# Patient Record
Sex: Male | Born: 2000 | Race: White | Hispanic: No | Smoking: Never smoker
Health system: Southern US, Community
[De-identification: ages and names within clinical notes are randomized; demographics above are authoritative.]

---
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
## 2022-08-15 ENCOUNTER — Other Ambulatory Visit: Payer: Self-pay

## 2022-08-15 ENCOUNTER — Emergency Department (HOSPITAL_BASED_OUTPATIENT_CLINIC_OR_DEPARTMENT_OTHER): Payer: Medicaid Other

## 2022-08-15 ENCOUNTER — Encounter (HOSPITAL_BASED_OUTPATIENT_CLINIC_OR_DEPARTMENT_OTHER): Payer: Self-pay | Admitting: Emergency Medicine

## 2022-08-15 ENCOUNTER — Emergency Department (HOSPITAL_BASED_OUTPATIENT_CLINIC_OR_DEPARTMENT_OTHER)
Admission: EM | Admit: 2022-08-15 | Discharge: 2022-08-15 | Disposition: A | Discharge status: Home / Self Care | Payer: Medicaid Other | Attending: Emergency Medicine | Admitting: Emergency Medicine

## 2022-08-15 DIAGNOSIS — S92354A Nondisplaced fracture of fifth metatarsal bone, right foot, initial encounter for closed fracture: Secondary | ICD-10-CM | POA: Insufficient documentation

## 2022-08-15 DIAGNOSIS — X58XXXA Exposure to other specified factors, initial encounter: Secondary | ICD-10-CM | POA: Insufficient documentation

## 2022-08-15 DIAGNOSIS — S99921A Unspecified injury of right foot, initial encounter: Secondary | ICD-10-CM | POA: Diagnosis present

## 2022-08-15 DIAGNOSIS — Y9367 Activity, basketball: Secondary | ICD-10-CM | POA: Diagnosis not present

## 2022-08-15 NOTE — ED Provider Notes (Signed)
[facility] EMERGENCY DEPARTMENT Provider Note   CSN: [id number] Arrival date & time: 08/15/22  1123     History  Chief Complaint  Patient presents with   Foot Pain    Aaron Cross is a 21 y.o. male. Patient is presenting with right foot pain after injuring it while playing basketball about 1 week ago.  Has been weightbearing on it but is still having sharp pains when he puts weight on it.  He states that most the pain is on the lateral aspect of the right foot.  Denies any numbness or tingling.   Foot Pain       Home Medications Prior to Admission medications   Medication Sig Start Date End Date Taking? Authorizing Provider  diphenhydrAMINE (BENADRYL) 25 MG tablet Take 1 tablet (25 mg total) by mouth every 6 (six) hours as needed. 02/10/20   Horton, Mayer Masker, MD  famotidine (PEPCID) 20 MG tablet Take 1 tablet (20 mg total) by mouth daily. 02/10/20   Horton, Mayer Masker, MD  montelukast (SINGULAIR) 10 MG tablet Take 10 mg by mouth at bedtime.    [provider]  predniSONE (DELTASONE) 20 MG tablet Take 2 tablets (40 mg total) by mouth daily. 02/10/20   Horton, Mayer Masker, MD      Allergies    Patient has no known allergies.    Review of Systems   Review of Systems  Musculoskeletal:  Positive for arthralgias.  All other systems reviewed and are negative.   Physical Exam Updated Vital Signs BP 123/73   Pulse 76   Temp 98 F (36.7 C) (Oral)   Resp 19   SpO2 100%  Physical Exam Vitals and nursing note reviewed.  Constitutional:      General: He is not in acute distress.    Appearance: Normal appearance. He is well-developed. He is not ill-appearing, toxic-appearing or diaphoretic.  HENT:     Head: Normocephalic and atraumatic.     Nose: No nasal deformity.     Mouth/Throat:     Lips: Pink. No lesions.  Eyes:     General: Gaze aligned appropriately. No scleral icterus.       Right eye: No discharge.        Left eye: No discharge.      Conjunctiva/sclera: Conjunctivae normal.     Right eye: Right conjunctiva is not injected. No exudate or hemorrhage.    Left eye: Left conjunctiva is not injected. No exudate or hemorrhage. Pulmonary:     Effort: Pulmonary effort is normal. No respiratory distress.  Musculoskeletal:     Comments: Right ankle without any swelling.  There is some ecchymosis noted on the right lateral malleolus area.  He does some some mild swelling along the right lateral aspect of the midfoot.  That is where he is tender to touch.  He has a 2+ pedal pulse.  Sensation is intact.  Skin:    General: Skin is warm and dry.  Neurological:     Mental Status: He is alert and oriented to person, place, and time.  Psychiatric:        Mood and Affect: Mood normal.        Speech: Speech normal.        Behavior: Behavior normal. Behavior is cooperative.     ED Results / Procedures / Treatments   Labs (all labs ordered are listed, but only abnormal results are displayed) Labs Reviewed - No data to display  EKG None  Radiology  DG Foot Complete Right  Result Date: 08/15/2022 CLINICAL DATA:  Trauma, pain EXAM: RIGHT FOOT COMPLETE - 3+ VIEW COMPARISON:  None Available. FINDINGS: There is undisplaced fracture in the base of right fifth metatarsal. IMPRESSION: Undisplaced fracture is seen in the base of right fifth metatarsal. Electronically Signed   By: Ernie Avena M.D.   On: 08/15/2022 12:09    Procedures Procedures   Medications Ordered in ED Medications - No data to display  ED Course/ Medical Decision Making/ A&P                           Medical Decision Making Amount and/or Complexity of Data Reviewed Radiology: ordered.   Patient is presenting with continued right foot pain after basketball injury that he had about 1 week ago.  His exam is neurovascularly intact.  He has soft compartments.  We did an x-ray today that reveals a nondisplaced right fifth metatarsal fracture.  This is consistent  with where the patient is having his symptoms.  I recommended a cam boot as well as orthopedic follow-up.  Supportive treatment for pain is recommended at home.  Final Clinical Impression(s) / ED Diagnoses Final diagnoses:  Closed nondisplaced fracture of fifth metatarsal bone of right foot, initial encounter    Rx / DC Orders ED Discharge Orders     None         Claudie Leach, PA-C 08/15/22 1359    Sloan Leiter, DO 08/17/22 [phone]

## 2022-08-15 NOTE — ED Triage Notes (Signed)
Patient presents to ED via POV from home. Here with right foot pain after injuring it playing basketball a week ago. Limping gait noted.

## 2022-08-15 NOTE — Discharge Instructions (Signed)
You have fractured your fifth metatarsal.  We have placed you in a cam boot.  Use this while walking.  Recommend ibuprofen and Tylenol for pain at home.  Also recommend nonweightbearing when not wearing the boot.  Use ice as needed.  Elevate to decrease swelling.  Please follow-up with Dr. Victorino Dike with orthopedics

## 2022-12-02 ENCOUNTER — Other Ambulatory Visit: Payer: Self-pay

## 2022-12-02 ENCOUNTER — Encounter (HOSPITAL_BASED_OUTPATIENT_CLINIC_OR_DEPARTMENT_OTHER): Payer: Self-pay | Admitting: Emergency Medicine

## 2022-12-02 ENCOUNTER — Emergency Department (HOSPITAL_BASED_OUTPATIENT_CLINIC_OR_DEPARTMENT_OTHER)
Admission: EM | Admit: 2022-12-02 | Discharge: 2022-12-02 | Disposition: A | Discharge status: Home / Self Care | Payer: Medicaid Other | Attending: Emergency Medicine | Admitting: Emergency Medicine

## 2022-12-02 DIAGNOSIS — Z1152 Encounter for screening for COVID-19: Secondary | ICD-10-CM | POA: Diagnosis not present

## 2022-12-02 DIAGNOSIS — J101 Influenza due to other identified influenza virus with other respiratory manifestations: Secondary | ICD-10-CM | POA: Insufficient documentation

## 2022-12-02 DIAGNOSIS — R059 Cough, unspecified: Secondary | ICD-10-CM | POA: Diagnosis present

## 2022-12-02 LAB — RESP PANEL BY RT-PCR (FLU A&B, COVID) ARPGX2
Influenza A by PCR: POSITIVE — AB
Influenza B by PCR: NEGATIVE
SARS Coronavirus 2 by RT PCR: NEGATIVE

## 2022-12-02 MED ORDER — BENZONATATE 100 MG PO CAPS: 100.0000 mg | ORAL_CAPSULE | Freq: Three times a day (TID) | ORAL | 0 refills | Status: AC

## 2022-12-02 MED ORDER — FLUTICASONE PROPIONATE 50 MCG/ACT NA SUSP: 2.0000 | Freq: Every day | NASAL | 0 refills | Status: AC

## 2022-12-02 NOTE — ED Notes (Signed)
Pt verbalized understanding of d/c instructions, meds, and followup care. Denies questions. VSS, no distress noted. Steady gait to exit with all belongings.  ?

## 2022-12-02 NOTE — Discharge Instructions (Addendum)
Take the medications as prescribed  Return for new or worsening symptoms 

## 2022-12-02 NOTE — ED Provider Notes (Signed)
[facility] EMERGENCY DEPT Provider Note   CSN: [id number] Arrival date & time: 12/02/22  1326    History  Chief Complaint  Patient presents with   Cough    Aaron Cross is a 21 y.o. male here for evaluation of cough.  Cough for last 2 days.  Some congestion rhinorrhea.  States when he coughs his chest feels tight however resolves after not coughing.  No pain or swelling to lower legs.  No history of PE or DVT.  Family members positive for flu A.  No fever, shortness of breath, back pain.  No meds PTA.  Feels like when he coughs he "cannot get it up."  No hemoptysis.  He is tolerating p.o. intake at home. Significant other here with similar sx  HPI     Home Medications Prior to Admission medications   Medication Sig Start Date End Date Taking? Authorizing Provider  benzonatate (TESSALON) 100 MG capsule Take 1 capsule (100 mg total) by mouth every 8 (eight) hours. 12/02/22  Yes Fabienne Nolasco A, PA-C  fluticasone (FLONASE) 50 MCG/ACT nasal spray Place 2 sprays into both nostrils daily. 12/02/22  Yes Charlee Whitebread A, PA-C  diphenhydrAMINE (BENADRYL) 25 MG tablet Take 1 tablet (25 mg total) by mouth every 6 (six) hours as needed. 02/10/20   Horton, Mayer Masker, MD  famotidine (PEPCID) 20 MG tablet Take 1 tablet (20 mg total) by mouth daily. 02/10/20   Horton, Mayer Masker, MD  montelukast (SINGULAIR) 10 MG tablet Take 10 mg by mouth at bedtime.    [provider]  predniSONE (DELTASONE) 20 MG tablet Take 2 tablets (40 mg total) by mouth daily. 02/10/20   Horton, Mayer Masker, MD      Allergies    Patient has no known allergies.    Review of Systems   Review of Systems  Constitutional: Negative.   HENT: Negative.    Respiratory:  Positive for cough. Negative for apnea, choking, chest tightness, shortness of breath, wheezing and stridor.   Cardiovascular:  Positive for chest pain (with cough, resolved when not coughing).  Gastrointestinal: Negative.    Genitourinary: Negative.   Musculoskeletal: Negative.   Skin: Negative.   Neurological: Negative.   All other systems reviewed and are negative.   Physical Exam Updated Vital Signs BP 125/82 (BP Location: Right Arm)   Pulse 90   Temp (!) 97.5 F (36.4 C)   Resp 18   Ht 6' (1.829 m)   Wt 79.4 kg   SpO2 100%   BMI 23.73 kg/m  Physical Exam Vitals and nursing note reviewed.  Constitutional:      General: He is not in acute distress.    Appearance: He is well-developed. He is not ill-appearing, toxic-appearing or diaphoretic.  HENT:     Head: Normocephalic and atraumatic.     Nose: Rhinorrhea present. No congestion.     Mouth/Throat:     Mouth: Mucous membranes are moist.  Eyes:     Pupils: Pupils are equal, round, and reactive to light.  Cardiovascular:     Rate and Rhythm: Normal rate and regular rhythm.     Pulses: Normal pulses.     Heart sounds: Normal heart sounds.  Pulmonary:     Effort: Pulmonary effort is normal. No respiratory distress.     Breath sounds: Normal breath sounds. No stridor. No wheezing, rhonchi or rales.  Chest:     Chest wall: No tenderness.  Abdominal:     General: Bowel sounds are normal.  There is no distension.     Palpations: Abdomen is soft.     Tenderness: There is no abdominal tenderness. There is no guarding or rebound.  Musculoskeletal:        General: No swelling, tenderness, deformity or signs of injury. Normal range of motion.     Cervical back: Normal range of motion and neck supple.     Right lower leg: No edema.     Left lower leg: No edema.  Skin:    General: Skin is warm and dry.     Capillary Refill: Capillary refill takes less than 2 seconds.  Neurological:     General: No focal deficit present.     Mental Status: He is alert and oriented to person, place, and time.    ED Results / Procedures / Treatments   Labs (all labs ordered are listed, but only abnormal results are displayed) Labs Reviewed  RESP PANEL BY  RT-PCR (FLU A&B, COVID) ARPGX2 - Abnormal; Notable for the following components:      Result Value   Influenza A by PCR POSITIVE (*)    All other components within normal limits    EKG None  Radiology No results found.  Procedures Procedures    Medications Ordered in ED Medications - No data to display  ED Course/ Medical Decision Making/ A&P    21 year old here for evaluation of cough.  Symptoms x 2 days.  Family members with similar symptoms. Does admit to some chest pain when he coughs however resolves when he is not coughing.  No hemoptysis. He is PERC negative.  No lower extremity edema.  No shortness of breath.  His heart and lungs are clear.  He is clinically well-hydrated.  Labs personally viewed and interpreted:  Flu A positive  Will treat symptomatically.  FU closely outpatient  The patient has been appropriately medically screened and/or stabilized in the ED. I have low suspicion for any other emergent medical condition which would require further screening, evaluation or treatment in the ED or require inpatient management.  Patient is hemodynamically stable and in no acute distress.  Patient able to ambulate in department prior to ED.  Evaluation does not show acute pathology that would require ongoing or additional emergent interventions while in the emergency department or further inpatient treatment.  I have discussed the diagnosis with the patient and answered all questions.  Pain is been managed while in the emergency department and patient has no further complaints prior to discharge.  Patient is comfortable with plan discussed in room and is stable for discharge at this time.  I have discussed strict return precautions for returning to the emergency department.  Patient was encouraged to follow-up with PCP/specialist refer to at discharge.                            Medical Decision Making Amount and/or Complexity of Data Reviewed External Data Reviewed:  labs and notes. Labs: ordered. Decision-making details documented in ED Course.  Risk OTC drugs. Prescription drug management. Parenteral controlled substances. Diagnosis or treatment significantly limited by social determinants of health.           Final Clinical Impression(s) / ED Diagnoses Final diagnoses:  Influenza A    Rx / DC Orders ED Discharge Orders          Ordered    benzonatate (TESSALON) 100 MG capsule  Every 8 hours  12/02/22 1534    fluticasone (FLONASE) 50 MCG/ACT nasal spray  Daily        12/02/22 1534              Tamirah George A, PA-C 12/02/22 1535    Derwood Kaplan, MD 12/03/22 1925

## 2022-12-02 NOTE — ED Triage Notes (Addendum)
Arrives POV. Ambulatory. Aox4. Reports coughing x2 days- central CP with cough-unproductive-lung sounds clear. Mother +Flu A. No SOB. No health HX to report. Afebrile.

## 2023-07-24 NOTE — Therapy (Incomplete)
OUTPATIENT PHYSICAL THERAPY TREATMENT   Patient Name: Aaron Cross MRN: [id number] DOB:[birth year], 22 y.o., male Today's Date: 07/25/2023  END OF SESSION:  PT End of Session - 07/25/23 0919     Visit Number 1    Date for PT Re-Evaluation 09/19/23    Authorization Type Medicaid-[employer] 27 visits    Authorization - Visit Number 1    Authorization - Number of Visits 27    PT Start Time 0847    PT Stop Time 0919    PT Time Calculation (min) 32 min    Activity Tolerance Patient tolerated treatment well    Behavior During Therapy [facility] for tasks assessed/performed             History reviewed. No pertinent past medical history. History reviewed. No pertinent surgical history. There are no problems to display for this patient.   PCP: Redge Gainer, MD   REFERRING PROVIDER: Dannielle Huh, MD  REFERRING DIAG: [phone] (ICD-10-CM) - Unspecified injury of unspecified quadriceps muscle, fascia and tendon, initial encounter   THERAPY DIAG:  Muscle weakness (generalized) - Plan: PT plan of care cert/re-cert  Other abnormalities of gait and mobility - Plan: PT plan of care cert/re-cert  Chronic pain of left knee - Plan: PT plan of care cert/re-cert  Rationale for Evaluation and Treatment: Rehabilitation  ONSET DATE: 6 months ago  SUBJECTIVE:   SUBJECTIVE STATEMENT: Pt presents to PT with 6 month history of Lt lateral quad pain.  Pt was doing flooring installation for work and noticed an increase in pain over the past week.    PERTINENT HISTORY: None  PAIN:  Are you having pain? Yes: NPRS scale: 5-6/10 Pain location: Lt lateral quad/knee Pain description: strain Aggravating factors: bending Lt knee, after playing basketball, installing flooring at work Relieving factors: unknown  PRECAUTIONS: None  RED FLAGS: None   WEIGHT BEARING RESTRICTIONS: No  FALLS:  Has patient fallen in last 6 months? No  LIVING ENVIRONMENT: Lives with: lives alone Lives in:  House/apartment Stairs: No Has following equipment at home: None  OCCUPATION: Holiday representative work   PLOF: Independent, Vocation/Vocational requirements: Holiday representative, and Leisure: basketball  PATIENT GOALS: reduce Lt leg pain and bend knee without pain  NEXT MD VISIT: as needd   OBJECTIVE:   DIAGNOSTIC FINDINGS: x-ray was negative   PATIENT SURVEYS:  LEFS 59/80=73.73% of maximum function  COGNITION: Overall cognitive status: Within functional limits for tasks assessed     SENSATION: WFL  MUSCLE LENGTH: Lt hamstring limited by 25%  POSTURE: rounded shoulders and forward head  PALPATION: Palpable tension in medial and lateral distal quads.  Mild tension over distal medial quad  LOWER EXTREMITY ROM: Lt hamstring limited by 25%.  Full Lt knee A/ROM with pain at end range with overpressure  LOWER EXTREMITY MMT:  MMT Right eval Left eval  Hip flexion 5 4 (SLR)  Hip extension 5 5  Hip abduction 5 4+  Hip adduction    Hip internal rotation    Hip external rotation    Knee flexion 5 4-  Knee extension 5 4-  Ankle dorsiflexion 5 4+  Ankle plantarflexion    Ankle inversion    Ankle eversion     (Blank rows = not tested)  GAIT: Distance walked: 100 Assistive device utilized: None Level of assistance: Complete Independence Comments: antalgia with reduced time on Lt in stance. Steps: step to leading up with right and down with Lt.  Use of 1 rail.  Instability with ascending and descending on  Lt   TODAY'S TREATMENT:                                                                                                                              DATE: 07/25/23 HEP established-see below If treatment provided at initial evaluation, no treatment charged due to lack of authorization.       PATIENT EDUCATION:  Education details: Access Code: [email or web address] Person educated: Patient Education method: Explanation, Demonstration, and Handouts Education comprehension: verbalized  understanding and returned demonstration  HOME EXERCISE PROGRAM: Access Code: [email or web address] URL: [email or web address] Date: 07/25/2023 Prepared by: Tresa Endo  Exercises - Supine Quad Set  - 3 x daily - 7 x weekly - 1 sets - 10 reps - 5 hold - Supine Heel Slide  - 2 x daily - 7 x weekly - 1 sets - 10 reps - 10 hold - Seated Knee Flexion Extension AROM   - 2 x daily - 7 x weekly - 1 sets - 10 reps - Supine Active Straight Leg Raise  - 2 x daily - 7 x weekly - 1-2 sets - 5-10 reps - Seated Long Arc Quad  - 2 x daily - 7 x weekly - 1-2 sets - 5-10 reps - 5 hold  ASSESSMENT:  CLINICAL IMPRESSION: Patient is a 22 y.o. male who was seen today for physical therapy evaluation and treatment for Lt lateral leg pain that began ~6 months ago.  Pt reports that his leg has "felt different" over the past 6 months and he began to have more discomfort ~1 week ago after installing flooring at work.  Pt reports 5-6/10 Lt lateral quad and knee pain that is worse with bending the knee, squatting and playing basketball.  Pt reports that his Lt LE feels weaker vs the Rt when lifting weights and he experiencing shaking.   Pt demonstrates fair quad set on the Lt, VMO atrophy, reduced Lt knee and hip flexor strength.  Gait is antalgic with reduced time on Lt and pt negotiates steps with step-to gait ascending and descending.  Patient will benefit from skilled PT to address the below impairments and improve overall function.   OBJECTIVE IMPAIRMENTS: Abnormal gait, decreased activity tolerance, difficulty walking, decreased strength, increased muscle spasms, impaired flexibility, and pain.   ACTIVITY LIMITATIONS: standing, squatting, stairs, transfers, and locomotion level  PARTICIPATION LIMITATIONS: driving, community activity, and occupation  PERSONAL FACTORS: Time since onset of injury/illness/exacerbation are also affecting patient's functional outcome.   REHAB POTENTIAL: Good  CLINICAL DECISION MAKING:  Stable/uncomplicated  EVALUATION COMPLEXITY: Low   GOALS: Goals reviewed with patient? Yes  SHORT TERM GOALS: Target date: 08/22/2023   Be independent in initial HEP Baseline: Goal status: INITIAL  2.  Report < or = to 4/10 Lt knee pain with flexion or after playing basketball  Baseline: 5-6/10 Goal status: INITIAL  3.  Improve Lt quad strength to ascending steps with step-over-step gait with use of 1 rail Baseline: step to and  instability of Lt LE Goal status: INITIAL  4.  Demonstrate symmetry with ambulation on level surfaces  Baseline: antalgia with reduce time spent in stance  Goal status: INITIAL    LONG TERM GOALS: Target date: 09/19/2023    Be independent in advanced HEP Baseline:  Goal status: INITIAL  2.  Improve LEFS to > or 75/80 Baseline: 59/80 Goal status: INITIAL  3.  Improve LE strength to ascend and descend steps with step-over-step gait and use of 1 rail Baseline: step-to Goal status: INITIAL  4.  Demonstrate symmetry with gait on all surfaces  Baseline:  Goal status: INITIAL  5.  Report < or = to 2-3/10 max Lt knee pain with activity  Baseline:  Goal status: INITIAL     PLAN:  PT FREQUENCY: 2x/week  PT DURATION: 8 weeks  PLANNED INTERVENTIONS: Therapeutic exercises, Therapeutic activity, Neuromuscular re-education, Balance training, Gait training, Patient/Family education, Self Care, Joint mobilization, Stair training, Aquatic Therapy, Dry Needling, Electrical stimulation, Cryotherapy, Moist heat, Taping, Vasopneumatic device, Ionotophoresis 4mg /ml Dexamethasone, and Manual therapy  PLAN FOR NEXT SESSION: review HEP, work on steps, DN to [employer], PT 07/25/23 9:59 AM   [facility] [address], [address] [place], [place] [address] Phone # [phone] Fax [phone]

## 2023-07-25 ENCOUNTER — Other Ambulatory Visit: Payer: Self-pay

## 2023-07-25 ENCOUNTER — Ambulatory Visit: Payer: Medicaid Other | Attending: Orthopedic Surgery

## 2023-07-25 DIAGNOSIS — G8929 Other chronic pain: Secondary | ICD-10-CM | POA: Insufficient documentation

## 2023-07-25 DIAGNOSIS — R2689 Other abnormalities of gait and mobility: Secondary | ICD-10-CM | POA: Insufficient documentation

## 2023-07-25 DIAGNOSIS — M25562 Pain in left knee: Secondary | ICD-10-CM | POA: Diagnosis present

## 2023-07-25 DIAGNOSIS — M6281 Muscle weakness (generalized): Secondary | ICD-10-CM | POA: Diagnosis present

## 2023-07-26 ENCOUNTER — Ambulatory Visit: Payer: Medicaid Other | Admitting: Rehabilitative and Restorative Service Providers"

## 2023-07-26 ENCOUNTER — Encounter: Payer: Self-pay | Admitting: Rehabilitative and Restorative Service Providers"

## 2023-07-26 DIAGNOSIS — R2689 Other abnormalities of gait and mobility: Secondary | ICD-10-CM

## 2023-07-26 DIAGNOSIS — G8929 Other chronic pain: Secondary | ICD-10-CM

## 2023-07-26 DIAGNOSIS — M6281 Muscle weakness (generalized): Secondary | ICD-10-CM | POA: Diagnosis not present

## 2023-07-26 NOTE — Therapy (Signed)
OUTPATIENT PHYSICAL THERAPY TREATMENT   Patient Name: Aaron Cross MRN: [id number] DOB:[birth year], 22 y.o., male Today's Date: 07/26/2023  END OF SESSION:  PT End of Session - 07/26/23 0937     Visit Number 2    Date for PT Re-Evaluation 09/19/23    Authorization Type Medicaid-[employer] 27 visits    Authorization - Visit Number 2    Authorization - Number of Visits 27    PT Start Time 0934    PT Stop Time 1013    PT Time Calculation (min) 39 min    Activity Tolerance Patient tolerated treatment well    Behavior During Therapy [facility] for tasks assessed/performed             History reviewed. No pertinent past medical history. History reviewed. No pertinent surgical history. There are no problems to display for this patient.   PCP: Redge Gainer, MD   REFERRING PROVIDER: Dannielle Huh, MD  REFERRING DIAG: [phone] (ICD-10-CM) - Unspecified injury of unspecified quadriceps muscle, fascia and tendon, initial encounter   THERAPY DIAG:  Muscle weakness (generalized)  Other abnormalities of gait and mobility  Chronic pain of left knee  Rationale for Evaluation and Treatment: Rehabilitation  ONSET DATE: 6 months ago  SUBJECTIVE:   SUBJECTIVE STATEMENT: Pt states that he did some of his exercises.  PERTINENT HISTORY: None   PAIN:  Are you having pain? Yes: NPRS scale: 4-5/10 Pain location: Lt lateral quad/knee Pain description: strain Aggravating factors: bending Lt knee, after playing basketball, installing flooring at work Relieving factors: unknown  PRECAUTIONS: None  RED FLAGS: None   WEIGHT BEARING RESTRICTIONS: No  FALLS:  Has patient fallen in last 6 months? No  LIVING ENVIRONMENT: Lives with: lives alone Lives in: House/apartment Stairs: No Has following equipment at home: None  OCCUPATION: Holiday representative work   PLOF: Independent, Vocation/Vocational requirements: Holiday representative, and Leisure: basketball  PATIENT GOALS: reduce Lt leg  pain and bend knee without pain  NEXT MD VISIT: as needd   OBJECTIVE:   DIAGNOSTIC FINDINGS: x-ray was negative   PATIENT SURVEYS:  Eval:  LEFS 59/80=73.73% of maximum function  COGNITION: Overall cognitive status: Within functional limits for tasks assessed     SENSATION: WFL  MUSCLE LENGTH: Lt hamstring limited by 25%  POSTURE: rounded shoulders and forward head  PALPATION: Palpable tension in medial and lateral distal quads.  Mild tension over distal medial quad  LOWER EXTREMITY ROM: Lt hamstring limited by 25%.  Full Lt knee A/ROM with pain at end range with overpressure  LOWER EXTREMITY MMT:  MMT Right eval Left eval  Hip flexion 5 4 (SLR)  Hip extension 5 5  Hip abduction 5 4+  Hip adduction    Hip internal rotation    Hip external rotation    Knee flexion 5 4-  Knee extension 5 4-  Ankle dorsiflexion 5 4+  Ankle plantarflexion    Ankle inversion    Ankle eversion     (Blank rows = not tested)  GAIT: Distance walked: 100 Assistive device utilized: None Level of assistance: Complete Independence Comments: antalgia with reduced time on Lt in stance. Steps: step to leading up with right and down with Lt.  Use of 1 rail.  Instability with ascending and descending on Lt   TODAY'S TREATMENT:  DATE: 07/26/2023 Nustep level 3 with LE only x5 min with PT present to discuss status Seated hamstring stretch 2x20 sec bilat Standing quad stretch with support of chair x20 sec bilat Seated with 2# LAQ with ball squeeze 2x10 Supine hip adduction ball squeeze x20 Supine LLE quad set 2x10 Supine heel slide with foot on slider 2x10 Supine hip abduction with foot on slider x20 Supine LLE straight leg raise 2x10 Supine clamshells with yellow loop 2x10 (with cuing to slow down to improve muscle activation) Seated glute sets x20 Standing rocker  board PF/DF x2 min   DATE: 07/25/23 HEP established-see below If treatment provided at initial evaluation, no treatment charged due to lack of authorization.       PATIENT EDUCATION:  Education details: Access Code: [email or web address] Person educated: Patient Education method: Explanation, Demonstration, and Handouts Education comprehension: verbalized understanding and returned demonstration  HOME EXERCISE PROGRAM: Access Code: [email or web address] URL: [email or web address] Date: 07/25/2023 Prepared by: Tresa Endo  Exercises - Supine Quad Set  - 3 x daily - 7 x weekly - 1 sets - 10 reps - 5 hold - Supine Heel Slide  - 2 x daily - 7 x weekly - 1 sets - 10 reps - 10 hold - Seated Knee Flexion Extension AROM   - 2 x daily - 7 x weekly - 1 sets - 10 reps - Supine Active Straight Leg Raise  - 2 x daily - 7 x weekly - 1-2 sets - 5-10 reps - Seated Long Arc Quad  - 2 x daily - 7 x weekly - 1-2 sets - 5-10 reps - 5 hold  ASSESSMENT:  CLINICAL IMPRESSION: Thane presents to skilled PT reporting that he has been noticing now how much weaker his left leg is.  Patient able to progress with strengthening during session with cuing for improved technique.  Patient with fatigue reported throughout and requires brief seated recovery periods throughout.  Pt continues to require skilled PT to progress with goal related activities.  OBJECTIVE IMPAIRMENTS: Abnormal gait, decreased activity tolerance, difficulty walking, decreased strength, increased muscle spasms, impaired flexibility, and pain.   ACTIVITY LIMITATIONS: standing, squatting, stairs, transfers, and locomotion level  PARTICIPATION LIMITATIONS: driving, community activity, and occupation  PERSONAL FACTORS: Time since onset of injury/illness/exacerbation are also affecting patient's functional outcome.   REHAB POTENTIAL: Good  CLINICAL DECISION MAKING: Stable/uncomplicated  EVALUATION COMPLEXITY: Low   GOALS: Goals reviewed with patient?  Yes  SHORT TERM GOALS: Target date: 08/22/2023   Be independent in initial HEP Baseline: Goal status: ONGOING  2.  Report < or = to 4/10 Lt knee pain with flexion or after playing basketball  Baseline: 5-6/10 Goal status: INITIAL  3.  Improve Lt quad strength to ascending steps with step-over-step gait with use of 1 rail Baseline: step to and instability of Lt LE Goal status: INITIAL  4.  Demonstrate symmetry with ambulation on level surfaces  Baseline: antalgia with reduce time spent in stance  Goal status: INITIAL    LONG TERM GOALS: Target date: 09/19/2023    Be independent in advanced HEP Baseline:  Goal status: INITIAL  2.  Improve LEFS to > or 75/80 Baseline: 59/80 Goal status: INITIAL  3.  Improve LE strength to ascend and descend steps with step-over-step gait and use of 1 rail Baseline: step-to Goal status: INITIAL  4.  Demonstrate symmetry with gait on all surfaces  Baseline:  Goal status: INITIAL  5.  Report < or = to 2-3/10 max Lt knee pain with activity  Baseline:  Goal status: INITIAL     PLAN:  PT FREQUENCY: 2x/week  PT DURATION: 8 weeks  PLANNED INTERVENTIONS: Therapeutic exercises, Therapeutic activity, Neuromuscular re-education, Balance training, Gait training, Patient/Family education, Self Care, Joint mobilization, Stair training, Aquatic Therapy, Dry Needling, Electrical stimulation, Cryotherapy, Moist heat, Taping, Vasopneumatic device, Ionotophoresis 4mg /ml Dexamethasone, and Manual therapy  PLAN FOR NEXT SESSION: review HEP, work on steps, DN to [employer], PT, DPT 07/26/23, 10:38 AM   [facility] [address], [address] [place], [place] [address] Phone # [phone] Fax [phone]

## 2023-08-02 ENCOUNTER — Encounter: Payer: Self-pay | Admitting: Rehabilitative and Restorative Service Providers"

## 2023-08-02 ENCOUNTER — Ambulatory Visit: Payer: Medicaid Other | Attending: Orthopedic Surgery | Admitting: Rehabilitative and Restorative Service Providers"

## 2023-08-02 DIAGNOSIS — M6281 Muscle weakness (generalized): Secondary | ICD-10-CM | POA: Insufficient documentation

## 2023-08-02 DIAGNOSIS — G8929 Other chronic pain: Secondary | ICD-10-CM | POA: Insufficient documentation

## 2023-08-02 DIAGNOSIS — R2689 Other abnormalities of gait and mobility: Secondary | ICD-10-CM | POA: Diagnosis present

## 2023-08-02 DIAGNOSIS — M25562 Pain in left knee: Secondary | ICD-10-CM | POA: Insufficient documentation

## 2023-08-02 NOTE — Therapy (Signed)
OUTPATIENT PHYSICAL THERAPY TREATMENT   Patient Name: Aaron Cross MRN: [id number] DOB:01-Sep-2001, 22 y.o., male Today's Date: 08/02/2023  END OF SESSION:  PT End of Session - 08/02/23 0942     Visit Number 3    Date for PT Re-Evaluation 09/19/23    Authorization Type [employer] 27 visits    Authorization - Visit Number 3    Authorization - Number of Visits 27    PT Start Time 0940   pt arrived late for appointment   PT Stop Time 1010    PT Time Calculation (min) 30 min    Activity Tolerance Patient tolerated treatment well    Behavior During Therapy [facility] for tasks assessed/performed             History reviewed. No pertinent past medical history. History reviewed. No pertinent surgical history. There are no problems to display for this patient.   PCP: Redge Gainer, MD   REFERRING PROVIDER: Dannielle Huh, MD  REFERRING DIAG: [phone] (ICD-10-CM) - Unspecified injury of unspecified quadriceps muscle, fascia and tendon, initial encounter   THERAPY DIAG:  Muscle weakness (generalized)  Other abnormalities of gait and mobility  Chronic pain of left knee  Rationale for Evaluation and Treatment: Rehabilitation  ONSET DATE: 6 months ago  SUBJECTIVE:   SUBJECTIVE STATEMENT: Pt reports that he has been doing his HEP.  States that he was able to play basketball yesterday.  PERTINENT HISTORY: None   PAIN:  Are you having pain? Yes: NPRS scale: 4/10 Pain location: Lt lateral quad/knee Pain description: strain Aggravating factors: bending Lt knee, after playing basketball, installing flooring at work Relieving factors: unknown  PRECAUTIONS: None  RED FLAGS: None   WEIGHT BEARING RESTRICTIONS: No  FALLS:  Has patient fallen in last 6 months? No  LIVING ENVIRONMENT: Lives with: lives alone Lives in: House/apartment Stairs: No Has following equipment at home: None  OCCUPATION: Holiday representative work   PLOF: Independent, Vocation/Vocational  requirements: Holiday representative, and Leisure: basketball  PATIENT GOALS: reduce Lt leg pain and bend knee without pain  NEXT MD VISIT: as needd   OBJECTIVE:   DIAGNOSTIC FINDINGS: x-ray was negative   PATIENT SURVEYS:  Eval:  LEFS 59/80=73.73% of maximum function  COGNITION: Overall cognitive status: Within functional limits for tasks assessed     SENSATION: WFL  MUSCLE LENGTH: Lt hamstring limited by 25%  POSTURE: rounded shoulders and forward head  PALPATION: Palpable tension in medial and lateral distal quads.  Mild tension over distal medial quad  LOWER EXTREMITY ROM: Lt hamstring limited by 25%.  Full Lt knee A/ROM with pain at end range with overpressure  LOWER EXTREMITY MMT:  MMT Right eval Left eval  Hip flexion 5 4 (SLR)  Hip extension 5 5  Hip abduction 5 4+  Hip adduction    Hip internal rotation    Hip external rotation    Knee flexion 5 4-  Knee extension 5 4-  Ankle dorsiflexion 5 4+  Ankle plantarflexion    Ankle inversion    Ankle eversion     (Blank rows = not tested)  GAIT: Distance walked: 100 Assistive device utilized: None Level of assistance: Complete Independence Comments: antalgia with reduced time on Lt in stance. Steps: step to leading up with right and down with Lt.  Use of 1 rail.  Instability with ascending and descending on Lt   TODAY'S TREATMENT:  DATE: 08/02/2023 Nustep level 5 with LE only x5 min with PT present to discuss status Standing hamstring stretch at stairs 2x20 sec bilat Standing quad stretch with UE support 2x20 sec bilat Wall squat 5x3 sec hold FWD lunge onto bosu x10 bilat Seated with 3# LAQ with ball squeeze 2x10 LLE Standing forward T 2x10 bilat (occasional UE support of counter)  Pt requires cuing for maintaining core engagement.   DATE: 07/26/2023 Nustep level 3 with LE only x5 min  with PT present to discuss status Seated hamstring stretch 2x20 sec bilat Standing quad stretch with support of chair x20 sec bilat Seated with 2# LAQ with ball squeeze 2x10 Supine hip adduction ball squeeze x20 Supine LLE quad set 2x10 Supine heel slide with foot on slider 2x10 Supine hip abduction with foot on slider x20 Supine LLE straight leg raise 2x10 Supine clamshells with yellow loop 2x10 (with cuing to slow down to improve muscle activation) Seated glute sets x20 Standing rocker board PF/DF x2 min   DATE: 07/25/23 HEP established-see below If treatment provided at initial evaluation, no treatment charged due to lack of authorization.       PATIENT EDUCATION:  Education details: Access Code: [email or web address] Person educated: Patient Education method: Explanation, Demonstration, and Handouts Education comprehension: verbalized understanding and returned demonstration  HOME EXERCISE PROGRAM: Access Code: [email or web address] URL: [email or web address] Date: 07/25/2023 Prepared by: Tresa Endo  Exercises - Supine Quad Set  - 3 x daily - 7 x weekly - 1 sets - 10 reps - 5 hold - Supine Heel Slide  - 2 x daily - 7 x weekly - 1 sets - 10 reps - 10 hold - Seated Knee Flexion Extension AROM   - 2 x daily - 7 x weekly - 1 sets - 10 reps - Supine Active Straight Leg Raise  - 2 x daily - 7 x weekly - 1-2 sets - 5-10 reps - Seated Long Arc Quad  - 2 x daily - 7 x weekly - 1-2 sets - 5-10 reps - 5 hold  ASSESSMENT:  CLINICAL IMPRESSION: Jamarian presents to skilled PT reporting that he was able to play basketball yesterday and did not need to wear his brace.  Pt reports that had less pain than he was anticipating.  Patient reports that he has been more compliant with his exercises and can tell that he is getting stronger.  Patient was able to progress with strengthening during session and pending tolerance to session, will update HEP tomorrow during session.  OBJECTIVE IMPAIRMENTS: Abnormal  gait, decreased activity tolerance, difficulty walking, decreased strength, increased muscle spasms, impaired flexibility, and pain.   ACTIVITY LIMITATIONS: standing, squatting, stairs, transfers, and locomotion level  PARTICIPATION LIMITATIONS: driving, community activity, and occupation  PERSONAL FACTORS: Time since onset of injury/illness/exacerbation are also affecting patient's functional outcome.   REHAB POTENTIAL: Good  CLINICAL DECISION MAKING: Stable/uncomplicated  EVALUATION COMPLEXITY: Low   GOALS: Goals reviewed with patient? Yes  SHORT TERM GOALS: Target date: 08/22/2023   Be independent in initial HEP Baseline: Goal status: ONGOING  2.  Report < or = to 4/10 Lt knee pain with flexion or after playing basketball  Baseline: 5-6/10 Goal status: MET on 08/02/2023  3.  Improve Lt quad strength to ascending steps with step-over-step gait with use of 1 rail Baseline: step to and instability of Lt LE Goal status: ONGOING  4.  Demonstrate symmetry with ambulation on level surfaces  Baseline: antalgia with reduce time spent in stance  Goal status: ONGOING  LONG TERM GOALS: Target date: 09/19/2023    Be independent in advanced HEP Baseline:  Goal status: INITIAL  2.  Improve LEFS to > or 75/80 Baseline: 59/80 Goal status: INITIAL  3.  Improve LE strength to ascend and descend steps with step-over-step gait and use of 1 rail Baseline: step-to Goal status: INITIAL  4.  Demonstrate symmetry with gait on all surfaces  Baseline:  Goal status: INITIAL  5.  Report < or = to 2-3/10 max Lt knee pain with activity  Baseline:  Goal status: INITIAL     PLAN:  PT FREQUENCY: 2x/week  PT DURATION: 8 weeks  PLANNED INTERVENTIONS: Therapeutic exercises, Therapeutic activity, Neuromuscular re-education, Balance training, Gait training, Patient/Family education, Self Care, Joint mobilization, Stair training, Aquatic Therapy, Dry Needling, Electrical stimulation,  Cryotherapy, Moist heat, Taping, Vasopneumatic device, Ionotophoresis 4mg /ml Dexamethasone, and Manual therapy  PLAN FOR NEXT SESSION: Update HEP, strengthening, step ups   Reather Laurence, PT, DPT 08/02/23, 10:17 AM   [facility] [address], [address] [place], [place] [address] Phone # [phone] Fax [phone]

## 2023-08-03 ENCOUNTER — Ambulatory Visit: Payer: Medicaid Other | Admitting: Rehabilitative and Restorative Service Providers"

## 2023-08-09 ENCOUNTER — Encounter: Payer: Medicaid Other | Admitting: Rehabilitative and Restorative Service Providers"

## 2023-08-11 ENCOUNTER — Ambulatory Visit: Payer: Medicaid Other | Admitting: Rehabilitative and Restorative Service Providers"

## 2023-08-16 ENCOUNTER — Ambulatory Visit: Payer: Medicaid Other | Admitting: Rehabilitative and Restorative Service Providers"

## 2023-08-16 ENCOUNTER — Encounter: Payer: Self-pay | Admitting: Rehabilitative and Restorative Service Providers"

## 2023-08-16 DIAGNOSIS — M6281 Muscle weakness (generalized): Secondary | ICD-10-CM

## 2023-08-16 DIAGNOSIS — G8929 Other chronic pain: Secondary | ICD-10-CM

## 2023-08-16 DIAGNOSIS — R2689 Other abnormalities of gait and mobility: Secondary | ICD-10-CM

## 2023-08-16 NOTE — Therapy (Signed)
OUTPATIENT PHYSICAL THERAPY TREATMENT   Patient Name: Aaron Cross MRN: [id number] DOB:[birth year], 22 y.o., male Today's Date: 08/16/2023  END OF SESSION:  PT End of Session - 08/16/23 0939     Visit Number 4    Date for PT Re-Evaluation 09/19/23    Authorization Type [employer] 27 visits    Authorization - Visit Number 4    Authorization - Number of Visits 27    PT Start Time 0936    PT Stop Time 1015    PT Time Calculation (min) 39 min    Activity Tolerance Patient tolerated treatment well    Behavior During Therapy [facility] for tasks assessed/performed             History reviewed. No pertinent past medical history. History reviewed. No pertinent surgical history. There are no problems to display for this patient.   PCP: Redge Gainer, MD   REFERRING PROVIDER: Dannielle Huh, MD  REFERRING DIAG: [phone] (ICD-10-CM) - Unspecified injury of unspecified quadriceps muscle, fascia and tendon, initial encounter   THERAPY DIAG:  Muscle weakness (generalized)  Other abnormalities of gait and mobility  Chronic pain of left knee  Rationale for Evaluation and Treatment: Rehabilitation  ONSET DATE: 6 months ago  SUBJECTIVE:   SUBJECTIVE STATEMENT: Pt reports that he has been unable to work last week due to being sick, so his leg is feeling better.  PERTINENT HISTORY: None   PAIN:  Are you having pain? Yes: NPRS scale: currently 4/10 Pain location: Lt lateral quad/knee Pain description: sore Aggravating factors: bending Lt knee, after playing basketball, installing flooring at work Relieving factors: unknown  PRECAUTIONS: None  RED FLAGS: None   WEIGHT BEARING RESTRICTIONS: No  FALLS:  Has patient fallen in last 6 months? No  LIVING ENVIRONMENT: Lives with: lives alone Lives in: House/apartment Stairs: No Has following equipment at home: None  OCCUPATION: Holiday representative work   PLOF: Independent, Vocation/Vocational requirements:  Holiday representative, and Leisure: basketball  PATIENT GOALS: reduce Lt leg pain and bend knee without pain  NEXT MD VISIT: as needd   OBJECTIVE:   DIAGNOSTIC FINDINGS: x-ray was negative   PATIENT SURVEYS:  Eval:  LEFS 59/80=73.73% of maximum function  COGNITION: Overall cognitive status: Within functional limits for tasks assessed     SENSATION: WFL  MUSCLE LENGTH: Lt hamstring limited by 25%  POSTURE: rounded shoulders and forward head  PALPATION: Palpable tension in medial and lateral distal quads.  Mild tension over distal medial quad  LOWER EXTREMITY ROM: Lt hamstring limited by 25%.  Full Lt knee A/ROM with pain at end range with overpressure  LOWER EXTREMITY MMT:  MMT Right eval Left eval  Hip flexion 5 4 (SLR)  Hip extension 5 5  Hip abduction 5 4+  Hip adduction    Hip internal rotation    Hip external rotation    Knee flexion 5 4-  Knee extension 5 4-  Ankle dorsiflexion 5 4+  Ankle plantarflexion    Ankle inversion    Ankle eversion     (Blank rows = not tested)  GAIT: Distance walked: 100 Assistive device utilized: None Level of assistance: Complete Independence Comments: antalgia with reduced time on Lt in stance. Steps: step to leading up with right and down with Lt.  Use of 1 rail.  Instability with ascending and descending on Lt   TODAY'S TREATMENT:  DATE: 08/16/2023 Nustep level 5 x6 min with PT present to discuss status Standing hamstring stretch at stairs 2x20 sec bilat Standing quad stretch with UE support 2x20 sec bilat Seated with 5# LAQ with ball squeeze 2x10 bilat Ambulation around PT gym with 5# bilat ankle weights 2 laps around both gyms Wall squat 5x3 sec hold Standing forward T 2x10 bilat (UE support of counter/barre)  Soft Tissue Mobilization to left quad with manual trigger point release to promote tissue  elongation and decreased pain   DATE: 08/02/2023 Nustep level 5 with LE only x5 min with PT present to discuss status Standing hamstring stretch at stairs 2x20 sec bilat Standing quad stretch with UE support 2x20 sec bilat Wall squat 5x3 sec hold FWD lunge onto bosu x10 bilat Seated with 3# LAQ with ball squeeze 2x10 LLE Standing forward T 2x10 bilat (occasional UE support of counter)  Pt requires cuing for maintaining core engagement.   DATE: 07/26/2023 Nustep level 3 with LE only x5 min with PT present to discuss status Seated hamstring stretch 2x20 sec bilat Standing quad stretch with support of chair x20 sec bilat Seated with 2# LAQ with ball squeeze 2x10 Supine hip adduction ball squeeze x20 Supine LLE quad set 2x10 Supine heel slide with foot on slider 2x10 Supine hip abduction with foot on slider x20 Supine LLE straight leg raise 2x10 Supine clamshells with yellow loop 2x10 (with cuing to slow down to improve muscle activation) Seated glute sets x20 Standing rocker board PF/DF x2 min      PATIENT EDUCATION:  Education details: Access Code: [email or web address] Person educated: Patient Education method: Explanation, Demonstration, and Handouts Education comprehension: verbalized understanding and returned demonstration  HOME EXERCISE PROGRAM: Access Code: [email or web address] URL: [email or web address] Date: 07/25/2023 Prepared by: Tresa Endo  Exercises - Supine Quad Set  - 3 x daily - 7 x weekly - 1 sets - 10 reps - 5 hold - Supine Heel Slide  - 2 x daily - 7 x weekly - 1 sets - 10 reps - 10 hold - Seated Knee Flexion Extension AROM   - 2 x daily - 7 x weekly - 1 sets - 10 reps - Supine Active Straight Leg Raise  - 2 x daily - 7 x weekly - 1-2 sets - 5-10 reps - Seated Long Arc Quad  - 2 x daily - 7 x weekly - 1-2 sets - 5-10 reps - 5 hold  ASSESSMENT:  CLINICAL IMPRESSION: Nahjee presents to skilled PT reporting that he has been doing some of his exercises while he was sick  last week, but has not been able to play basketball.  Patient reports that he feels like he has a knot on his left thigh that is leading to his increased pain.  Able to progress through exercises and add increased weight for LAQ today.  Performed soft tissue mobilizon at the end of session and patient with significant pain reduction at end of session.  Pt reports pain is 0/10 by end of session, following manual therapy.  OBJECTIVE IMPAIRMENTS: Abnormal gait, decreased activity tolerance, difficulty walking, decreased strength, increased muscle spasms, impaired flexibility, and pain.   ACTIVITY LIMITATIONS: standing, squatting, stairs, transfers, and locomotion level  PARTICIPATION LIMITATIONS: driving, community activity, and occupation  PERSONAL FACTORS: Time since onset of injury/illness/exacerbation are also affecting patient's functional outcome.   REHAB POTENTIAL: Good  CLINICAL DECISION MAKING: Stable/uncomplicated  EVALUATION COMPLEXITY: Low   GOALS: Goals reviewed with patient? Yes  SHORT TERM GOALS: Target date: 08/22/2023  Be independent in initial HEP Baseline: Goal status: MET on 08/16/2023  2.  Report < or = to 4/10 Lt knee pain with flexion or after playing basketball  Baseline: 5-6/10 Goal status: MET on 08/02/2023  3.  Improve Lt quad strength to ascending steps with step-over-step gait with use of 1 rail Baseline: step to and instability of Lt LE Goal status: ONGOING  4.  Demonstrate symmetry with ambulation on level surfaces  Baseline: antalgia with reduce time spent in stance  Goal status: MET on 08/16/2023    LONG TERM GOALS: Target date: 09/19/2023    Be independent in advanced HEP Baseline:  Goal status: INITIAL  2.  Improve LEFS to > or 75/80 Baseline: 59/80 Goal status: INITIAL  3.  Improve LE strength to ascend and descend steps with step-over-step gait and use of 1 rail Baseline: step-to Goal status: INITIAL  4.  Demonstrate symmetry with  gait on all surfaces  Baseline:  Goal status: INITIAL  5.  Report < or = to 2-3/10 max Lt knee pain with activity  Baseline:  Goal status: INITIAL     PLAN:  PT FREQUENCY: 2x/week  PT DURATION: 8 weeks  PLANNED INTERVENTIONS: Therapeutic exercises, Therapeutic activity, Neuromuscular re-education, Balance training, Gait training, Patient/Family education, Self Care, Joint mobilization, Stair training, Aquatic Therapy, Dry Needling, Electrical stimulation, Cryotherapy, Moist heat, Taping, Vasopneumatic device, Ionotophoresis 4mg /ml Dexamethasone, and Manual therapy  PLAN FOR NEXT SESSION: Update HEP, strengthening, step ups   Reather Laurence, PT, DPT 08/16/23, 10:36 AM   [facility] [address] [place], [place] [address] Phone # [phone] Fax [phone]

## 2023-08-17 ENCOUNTER — Encounter: Payer: Self-pay | Admitting: Rehabilitative and Restorative Service Providers"

## 2023-08-17 ENCOUNTER — Ambulatory Visit: Payer: Medicaid Other | Admitting: Rehabilitative and Restorative Service Providers"

## 2023-08-17 DIAGNOSIS — R2689 Other abnormalities of gait and mobility: Secondary | ICD-10-CM

## 2023-08-17 DIAGNOSIS — G8929 Other chronic pain: Secondary | ICD-10-CM

## 2023-08-17 DIAGNOSIS — M6281 Muscle weakness (generalized): Secondary | ICD-10-CM

## 2023-08-17 NOTE — Therapy (Signed)
OUTPATIENT PHYSICAL THERAPY TREATMENT   Patient Name: Aaron Cross MRN: [id number] DOB:28-Aug-2001, 22 y.o., male Today's Date: 08/17/2023  END OF SESSION:  PT End of Session - 08/17/23 0941     Visit Number 5    Date for PT Re-Evaluation 09/19/23    Authorization Type [employer] 27 visits    Authorization - Visit Number 5    Authorization - Number of Visits 27    PT Start Time [phone]   Pt arrived late   PT Stop Time 1016    PT Time Calculation (min) 38 min    Activity Tolerance Patient tolerated treatment well    Behavior During Therapy [facility] for tasks assessed/performed             History reviewed. No pertinent past medical history. History reviewed. No pertinent surgical history. There are no problems to display for this patient.   PCP: Redge Gainer, MD   REFERRING PROVIDER: Dannielle Huh, MD  REFERRING DIAG: [phone] (ICD-10-CM) - Unspecified injury of unspecified quadriceps muscle, fascia and tendon, initial encounter   THERAPY DIAG:  Muscle weakness (generalized)  Other abnormalities of gait and mobility  Chronic pain of left knee  Rationale for Evaluation and Treatment: Rehabilitation  ONSET DATE: 6 months ago  SUBJECTIVE:   SUBJECTIVE STATEMENT: Pt reports feeling better after session yesterday.  PERTINENT HISTORY: None   PAIN:  Are you having pain? Yes: NPRS scale: currently 4/10 Pain location: Lt lateral quad/knee Pain description: sore Aggravating factors: bending Lt knee, after playing basketball, installing flooring at work Relieving factors: unknown  PRECAUTIONS: None  RED FLAGS: None   WEIGHT BEARING RESTRICTIONS: No  FALLS:  Has patient fallen in last 6 months? No  LIVING ENVIRONMENT: Lives with: lives alone Lives in: House/apartment Stairs: No Has following equipment at home: None  OCCUPATION: Holiday representative work   PLOF: Independent, Vocation/Vocational requirements: Holiday representative, and Leisure:  basketball  PATIENT GOALS: reduce Lt leg pain and bend knee without pain  NEXT MD VISIT: as needd   OBJECTIVE:   DIAGNOSTIC FINDINGS: x-ray was negative   PATIENT SURVEYS:  Eval:  LEFS 59/80=73.73% of maximum function  COGNITION: Overall cognitive status: Within functional limits for tasks assessed     SENSATION: WFL  MUSCLE LENGTH: Lt hamstring limited by 25%  POSTURE: rounded shoulders and forward head  PALPATION: Palpable tension in medial and lateral distal quads.  Mild tension over distal medial quad  LOWER EXTREMITY ROM: Lt hamstring limited by 25%.  Full Lt knee A/ROM with pain at end range with overpressure  LOWER EXTREMITY MMT:  MMT Right eval Left eval  Hip flexion 5 4 (SLR)  Hip extension 5 5  Hip abduction 5 4+  Hip adduction    Hip internal rotation    Hip external rotation    Knee flexion 5 4-  Knee extension 5 4-  Ankle dorsiflexion 5 4+  Ankle plantarflexion    Ankle inversion    Ankle eversion     (Blank rows = not tested)  GAIT: Distance walked: 100 Assistive device utilized: None Level of assistance: Complete Independence Comments: antalgia with reduced time on Lt in stance. Steps: step to leading up with right and down with Lt.  Use of 1 rail.  Instability with ascending and descending on Lt   TODAY'S TREATMENT:  DATE: 08/17/2023 Nustep level 7 x6 min with PT present to discuss status Standing hamstring stretch at stairs 2x20 sec bilat Standing quad stretch with UE support 2x20 sec bilat Lateral lunges/curtsey 2x10 FWD lunge walking down hallway 2x10 Single leg stance with foot on flat side of half foam roll 2x20 sec bilat Leg Press (seat at 8) 120# 2x10 Single Leg Press (seat at 8) 60# 2x10 bilat 4 way resisted gait with 15# cable pulley x3 each direction Manual:  Addaday to left quad and lateral  thigh   DATE: 08/16/2023 Nustep level 5 x6 min with PT present to discuss status Standing hamstring stretch at stairs 2x20 sec bilat Standing quad stretch with UE support 2x20 sec bilat Seated with 5# LAQ with ball squeeze 2x10 bilat Ambulation around PT gym with 5# bilat ankle weights 2 laps around both gyms Wall squat 5x3 sec hold Standing forward T 2x10 bilat (UE support of counter/barre)  Soft Tissue Mobilization to left quad with manual trigger point release to promote tissue elongation and decreased pain   DATE: 08/02/2023 Nustep level 5 with LE only x5 min with PT present to discuss status Standing hamstring stretch at stairs 2x20 sec bilat Standing quad stretch with UE support 2x20 sec bilat Wall squat 5x3 sec hold FWD lunge onto bosu x10 bilat Seated with 3# LAQ with ball squeeze 2x10 LLE Standing forward T 2x10 bilat (occasional UE support of counter)  Pt requires cuing for maintaining core engagement.     PATIENT EDUCATION:  Education details: Access Code: [email or web address] Person educated: Patient Education method: Explanation, Demonstration, and Handouts Education comprehension: verbalized understanding and returned demonstration  HOME EXERCISE PROGRAM: Access Code: [email or web address] URL: [email or web address] Date: 08/17/2023 Prepared by: Reather Laurence  Exercises - Supine Active Straight Leg Raise  - 2 x daily - 7 x weekly - 1-2 sets - 5-10 reps - Sidelying Hip Abduction  - 1 x daily - 7 x weekly - 2 sets - 10 reps - Seated Long Arc Quad  - 2 x daily - 7 x weekly - 1-2 sets - 5-10 reps - 5 hold - Standing Hamstring Stretch on Chair  - 1 x daily - 7 x weekly - 2 reps - 20 sec hold - Quadriceps Stretch with Chair  - 1 x daily - 7 x weekly - 2 reps - 20 sec hold - Walking Forward Lunge  - 1 x daily - 7 x weekly - 2 sets - 10 reps - Lateral Single Leg Lunge Jumps  - 1 x daily - 7 x weekly - 2 sets - 10 reps  ASSESSMENT:  CLINICAL IMPRESSION: Aaron Cross presents to  skilled PT reporting that he felt significantly better after last visit and is denying current pain.  Patient able to progress with strengthening during this session and able to upgrade HEP.  Patient with improved stability and balance noted during session.  Patient provided with updated HEP and to try playing basketball again before next visit to assess progress.  At end of session, pt did report that he felt that he had more relaxation/relief of soreness with soft tissue mobilization provided by PT instead of instrument assisted performed today.  OBJECTIVE IMPAIRMENTS: Abnormal gait, decreased activity tolerance, difficulty walking, decreased strength, increased muscle spasms, impaired flexibility, and pain.   ACTIVITY LIMITATIONS: standing, squatting, stairs, transfers, and locomotion level  PARTICIPATION LIMITATIONS: driving, community activity, and occupation  PERSONAL FACTORS: Time since onset of injury/illness/exacerbation are also affecting patient's functional outcome.   REHAB POTENTIAL: Good  CLINICAL DECISION MAKING: Stable/uncomplicated  EVALUATION COMPLEXITY: Low   GOALS: Goals reviewed with patient? Yes  SHORT TERM GOALS: Target date: 08/22/2023   Be independent in initial HEP Baseline: Goal status: MET on 08/16/2023  2.  Report < or = to 4/10 Lt knee pain with flexion or after playing basketball  Baseline: 5-6/10 Goal status: MET on 08/02/2023  3.  Improve Lt quad strength to ascending steps with step-over-step gait with use of 1 rail Baseline: step to and instability of Lt LE Goal status: ONGOING  4.  Demonstrate symmetry with ambulation on level surfaces  Baseline: antalgia with reduce time spent in stance  Goal status: MET on 08/16/2023    LONG TERM GOALS: Target date: 09/19/2023    Be independent in advanced HEP Baseline:  Goal status: INITIAL  2.  Improve LEFS to > or 75/80 Baseline: 59/80 Goal status: INITIAL  3.  Improve LE strength to ascend and  descend steps with step-over-step gait and use of 1 rail Baseline: step-to Goal status: INITIAL  4.  Demonstrate symmetry with gait on all surfaces  Baseline:  Goal status: INITIAL  5.  Report < or = to 2-3/10 max Lt knee pain with activity  Baseline:  Goal status: INITIAL     PLAN:  PT FREQUENCY: 2x/week  PT DURATION: 8 weeks  PLANNED INTERVENTIONS: Therapeutic exercises, Therapeutic activity, Neuromuscular re-education, Balance training, Gait training, Patient/Family education, Self Care, Joint mobilization, Stair training, Aquatic Therapy, Dry Needling, Electrical stimulation, Cryotherapy, Moist heat, Taping, Vasopneumatic device, Ionotophoresis 4mg /ml Dexamethasone, and Manual therapy  PLAN FOR NEXT SESSION: Update HEP, strengthening, step ups   Reather Laurence, PT, DPT 08/17/23, 10:53 AM   [facility] [address], [address] [place], [place] [address] Phone # [phone] Fax [phone]

## 2023-08-23 ENCOUNTER — Ambulatory Visit: Payer: Medicaid Other | Admitting: Rehabilitative and Restorative Service Providers"

## 2023-08-23 ENCOUNTER — Encounter: Payer: Self-pay | Admitting: Rehabilitative and Restorative Service Providers"

## 2023-08-23 DIAGNOSIS — M6281 Muscle weakness (generalized): Secondary | ICD-10-CM | POA: Diagnosis not present

## 2023-08-23 DIAGNOSIS — R2689 Other abnormalities of gait and mobility: Secondary | ICD-10-CM

## 2023-08-23 DIAGNOSIS — G8929 Other chronic pain: Secondary | ICD-10-CM

## 2023-08-23 NOTE — Therapy (Signed)
OUTPATIENT PHYSICAL THERAPY TREATMENT   Patient Name: Aaron Cross MRN: [id number] DOB:06/21/2001, 22 y.o., male Today's Date: 08/23/2023  END OF SESSION:  PT End of Session - 08/23/23 0812     Visit Number 6    Date for PT Re-Evaluation 09/19/23    Authorization Type Medicaid[employer] 27 visits    Authorization - Visit Number 6    Authorization - Number of Visits 27    PT Start Time 0810   Pt arrived late   PT Stop Time 0840    PT Time Calculation (min) 30 min    Activity Tolerance Patient tolerated treatment well    Behavior During Therapy [facility] for tasks assessed/performed             History reviewed. No pertinent past medical history. History reviewed. No pertinent surgical history. There are no problems to display for this patient.   PCP: Redge Gainer, MD   REFERRING PROVIDER: Dannielle Huh, MD  REFERRING DIAG: [id number] (ICD-10-CM) - Unspecified injury of unspecified quadriceps muscle, fascia and tendon, initial encounter   THERAPY DIAG:  Muscle weakness (generalized)  Other abnormalities of gait and mobility  Chronic pain of left knee  Rationale for Evaluation and Treatment: Rehabilitation  ONSET DATE: 6 months ago  SUBJECTIVE:   SUBJECTIVE STATEMENT: Pt reports having increased soreness today.  Pt reports that she slid when walking on a grass hill at work and fell and caught himself with his hand, but reports that his left leg was the one that slipped.  Pt also reports that he has been going up on ladders at work.  PERTINENT HISTORY: None   PAIN:  Are you having pain? Yes: NPRS scale: currently 6-7/10 Pain location: Lt lateral quad/knee Pain description: sore Aggravating factors: bending Lt knee, after playing basketball, installing flooring at work Relieving factors: unknown  PRECAUTIONS: None  RED FLAGS: None   WEIGHT BEARING RESTRICTIONS: No  FALLS:  Has patient fallen in last 6 months? No  LIVING ENVIRONMENT: Lives with:  lives alone Lives in: House/apartment Stairs: No Has following equipment at home: None  OCCUPATION: Holiday representative work   PLOF: Independent, Vocation/Vocational requirements: Holiday representative, and Leisure: basketball  PATIENT GOALS: reduce Lt leg pain and bend knee without pain  NEXT MD VISIT: as needd   OBJECTIVE:   DIAGNOSTIC FINDINGS: x-ray was negative   PATIENT SURVEYS:  Eval:  LEFS 59/80=73.73% of maximum function  COGNITION: Overall cognitive status: Within functional limits for tasks assessed     SENSATION: WFL  MUSCLE LENGTH: Lt hamstring limited by 25%  POSTURE: rounded shoulders and forward head  PALPATION: Palpable tension in medial and lateral distal quads.  Mild tension over distal medial quad  LOWER EXTREMITY ROM: Lt hamstring limited by 25%.  Full Lt knee A/ROM with pain at end range with overpressure  LOWER EXTREMITY MMT:  MMT Right eval Left eval  Hip flexion 5 4 (SLR)  Hip extension 5 5  Hip abduction 5 4+  Hip adduction    Hip internal rotation    Hip external rotation    Knee flexion 5 4-  Knee extension 5 4-  Ankle dorsiflexion 5 4+  Ankle plantarflexion    Ankle inversion    Ankle eversion     (Blank rows = not tested)  GAIT: Distance walked: 100 Assistive device utilized: None Level of assistance: Complete Independence Comments: antalgia with reduced time on Lt in stance. Steps: step to leading up with right and down with Lt.  Use of 1 rail.  Instability with ascending and descending on Lt   TODAY'S TREATMENT:                                                                                                                               DATE: 08/23/2023 Nustep level 6 x5 min with PT present to discuss status Standing hamstring stretch 2x20 sec bilat FWD step ups with 8" step 2x10 bilat Standing quad stretch with UE support 2x20 sec bilat Leg Press (seat at 8) 120# 2x10 Single Leg Press (seat at 8) 60# 2x10 bilat Soft Tissue  Mobilization to left quad with manual trigger point release to promote tissue elongation and decreased pain   DATE: 08/17/2023 Nustep level 7 x6 min with PT present to discuss status Standing hamstring stretch at stairs 2x20 sec bilat Standing quad stretch with UE support 2x20 sec bilat Lateral lunges/curtsey 2x10 FWD lunge walking down hallway 2x10 Single leg stance with foot on flat side of half foam roll 2x20 sec bilat Leg Press (seat at 8) 120# 2x10 Single Leg Press (seat at 8) 60# 2x10 bilat 4 way resisted gait with 15# cable pulley x3 each direction Manual:  Addaday to left quad and lateral thigh   DATE: 08/16/2023 Nustep level 5 x6 min with PT present to discuss status Standing hamstring stretch at stairs 2x20 sec bilat Standing quad stretch with UE support 2x20 sec bilat Seated with 5# LAQ with ball squeeze 2x10 bilat Ambulation around PT gym with 5# bilat ankle weights 2 laps around both gyms Wall squat 5x3 sec hold Standing forward T 2x10 bilat (UE support of counter/barre)  Soft Tissue Mobilization to left quad with manual trigger point release to promote tissue elongation and decreased pain     PATIENT EDUCATION:  Education details: Access Code: [email or web address] Person educated: Patient Education method: Explanation, Demonstration, and Handouts Education comprehension: verbalized understanding and returned demonstration  HOME EXERCISE PROGRAM: Access Code: [email or web address] URL: [email or web address] Date: 08/17/2023 Prepared by: Reather Laurence  Exercises - Supine Active Straight Leg Raise  - 2 x daily - 7 x weekly - 1-2 sets - 5-10 reps - Sidelying Hip Abduction  - 1 x daily - 7 x weekly - 2 sets - 10 reps - Seated Long Arc Quad  - 2 x daily - 7 x weekly - 1-2 sets - 5-10 reps - 5 hold - Standing Hamstring Stretch on Chair  - 1 x daily - 7 x weekly - 2 reps - 20 sec hold - Quadriceps Stretch with Chair  - 1 x daily - 7 x weekly - 2 reps - 20 sec hold - Walking  Forward Lunge  - 1 x daily - 7 x weekly - 2 sets - 10 reps - Lateral Single Leg Lunge Jumps  - 1 x daily - 7 x weekly - 2 sets - 10 reps  ASSESSMENT:  CLINICAL IMPRESSION: Aaron Cross presents to skilled PT reporting increased pain today after working on [employer] and  having to navigate a ladder and slipping on a hill going to a job site.  Patient able to progress through session with some complaints of pain during leg press and step up exercise.  Pt with great response to manual therapy and following, reports that knee pain has decreased to 0/10.  OBJECTIVE IMPAIRMENTS: Abnormal gait, decreased activity tolerance, difficulty walking, decreased strength, increased muscle spasms, impaired flexibility, and pain.   ACTIVITY LIMITATIONS: standing, squatting, stairs, transfers, and locomotion level  PARTICIPATION LIMITATIONS: driving, community activity, and occupation  PERSONAL FACTORS: Time since onset of injury/illness/exacerbation are also affecting patient's functional outcome.   REHAB POTENTIAL: Good  CLINICAL DECISION MAKING: Stable/uncomplicated  EVALUATION COMPLEXITY: Low   GOALS: Goals reviewed with patient? Yes  SHORT TERM GOALS: Target date: 08/22/2023   Be independent in initial HEP Baseline: Goal status: MET on 08/16/2023  2.  Report < or = to 4/10 Lt knee pain with flexion or after playing basketball  Baseline: 5-6/10 Goal status: MET on 08/02/2023  3.  Improve Lt quad strength to ascending steps with step-over-step gait with use of 1 rail Baseline: step to and instability of Lt LE Goal status: MET on 08/23/23  4.  Demonstrate symmetry with ambulation on level surfaces  Baseline: antalgia with reduce time spent in stance  Goal status: MET on 08/16/2023    LONG TERM GOALS: Target date: 09/19/2023    Be independent in advanced HEP Baseline:  Goal status: Ongoing  2.  Improve LEFS to > or 75/80 Baseline: 59/80 Goal status: INITIAL  3.  Improve LE strength  to ascend and descend steps with step-over-step gait and use of 1 rail Baseline: step-to Goal status: INITIAL  4.  Demonstrate symmetry with gait on all surfaces  Baseline:  Goal status: Ongoing  5.  Report < or = to 2-3/10 max Lt knee pain with activity  Baseline:  Goal status: Ongoing     PLAN:  PT FREQUENCY: 2x/week  PT DURATION: 8 weeks  PLANNED INTERVENTIONS: Therapeutic exercises, Therapeutic activity, Neuromuscular re-education, Balance training, Gait training, Patient/Family education, Self Care, Joint mobilization, Stair training, Aquatic Therapy, Dry Needling, Electrical stimulation, Cryotherapy, Moist heat, Taping, Vasopneumatic device, Ionotophoresis 4mg /ml Dexamethasone, and Manual therapy  PLAN FOR NEXT SESSION: Update HEP as needed, strengthening, step ups   Reather Laurence, PT, DPT 08/23/23, 9:56 AM   [facility] [address] [place], [place] [address] Phone # [phone] Fax [phone]

## 2023-08-24 ENCOUNTER — Ambulatory Visit: Payer: Medicaid Other | Admitting: Rehabilitative and Restorative Service Providers"

## 2023-08-30 ENCOUNTER — Encounter: Payer: Self-pay | Admitting: Rehabilitative and Restorative Service Providers"

## 2023-08-30 ENCOUNTER — Ambulatory Visit: Payer: Medicaid Other | Attending: Orthopedic Surgery | Admitting: Rehabilitative and Restorative Service Providers"

## 2023-08-30 DIAGNOSIS — M6281 Muscle weakness (generalized): Secondary | ICD-10-CM | POA: Diagnosis present

## 2023-08-30 DIAGNOSIS — M25562 Pain in left knee: Secondary | ICD-10-CM | POA: Diagnosis present

## 2023-08-30 DIAGNOSIS — G8929 Other chronic pain: Secondary | ICD-10-CM | POA: Diagnosis present

## 2023-08-30 DIAGNOSIS — R2689 Other abnormalities of gait and mobility: Secondary | ICD-10-CM | POA: Diagnosis present

## 2023-08-30 NOTE — Therapy (Signed)
OUTPATIENT PHYSICAL THERAPY TREATMENT   Patient Name: Aaron Cross MRN: [id number] DOB:Jan 24, 2001, 22 y.o., male Today's Date: 08/30/2023  END OF SESSION:  PT End of Session - 08/30/23 0853     Visit Number 7    Date for PT Re-Evaluation 09/19/23    Authorization Type [employer] 27 visits    Authorization - Visit Number 7    Authorization - Number of Visits 27    PT Start Time 0852   Pt arrived late for appointment   PT Stop Time 0930    PT Time Calculation (min) 38 min    Activity Tolerance Patient tolerated treatment well    Behavior During Therapy [facility] for tasks assessed/performed             History reviewed. No pertinent past medical history. History reviewed. No pertinent surgical history. There are no problems to display for this patient.   PCP: Redge Gainer, MD   REFERRING PROVIDER: Dannielle Huh, MD  REFERRING DIAG: [phone] (ICD-10-CM) - Unspecified injury of unspecified quadriceps muscle, fascia and tendon, initial encounter   THERAPY DIAG:  Muscle weakness (generalized)  Other abnormalities of gait and mobility  Chronic pain of left knee  Rationale for Evaluation and Treatment: Rehabilitation  ONSET DATE: 6 months ago  SUBJECTIVE:   SUBJECTIVE STATEMENT: Pt reports that he has been doing a lot of the stretches.  Pt states that he went to the gym with a friend and may have done too much, as he was sore after.  Overall, he is reporting less pain than last time.  PERTINENT HISTORY: None   PAIN:  Are you having pain? Yes: NPRS scale: 3/10 Pain location: Lt lateral quad/knee Pain description: sore Aggravating factors: bending Lt knee, after playing basketball, installing flooring at work Relieving factors: unknown  PRECAUTIONS: None  RED FLAGS: None   WEIGHT BEARING RESTRICTIONS: No  FALLS:  Has patient fallen in last 6 months? No  LIVING ENVIRONMENT: Lives with: lives alone Lives in: House/apartment Stairs: No Has  following equipment at home: None  OCCUPATION: Holiday representative work   PLOF: Independent, Vocation/Vocational requirements: Holiday representative, and Leisure: basketball  PATIENT GOALS: reduce Lt leg pain and bend knee without pain  NEXT MD VISIT: as needed   OBJECTIVE:   DIAGNOSTIC FINDINGS: x-ray was negative   PATIENT SURVEYS:  Eval:  LEFS 59/80=73.73% of maximum function  COGNITION: Overall cognitive status: Within functional limits for tasks assessed     SENSATION: WFL  MUSCLE LENGTH: Lt hamstring limited by 25%  POSTURE: rounded shoulders and forward head  PALPATION: Palpable tension in medial and lateral distal quads.  Mild tension over distal medial quad  LOWER EXTREMITY ROM: Lt hamstring limited by 25%.  Full Lt knee A/ROM with pain at end range with overpressure  LOWER EXTREMITY MMT:  MMT Right eval Left eval  Hip flexion 5 4 (SLR)  Hip extension 5 5  Hip abduction 5 4+  Hip adduction    Hip internal rotation    Hip external rotation    Knee flexion 5 4-  Knee extension 5 4-  Ankle dorsiflexion 5 4+  Ankle plantarflexion    Ankle inversion    Ankle eversion     (Blank rows = not tested)  GAIT: Distance walked: 100 Assistive device utilized: None Level of assistance: Complete Independence Comments: antalgia with reduced time on Lt in stance. Steps: step to leading up with right and down with Lt.  Use of 1 rail.  Instability with ascending and descending on  Lt   TODAY'S TREATMENT:                                                                                                                               DATE: 08/30/2023 Nustep level 6 x5 min with PT present to discuss status Standing hamstring stretch 2x20 sec bilat FWD step ups onto PT mat x10 bilat Standing quad stretch with UE support 2x20 sec bilat FWD lunge walking down hallway 2x10 Lateral lunges/curtsey 2x10 Single leg sit to stand 2x10 bilat Seated butterfly stretch 2x20 sec  4 way resisted  gait with 15# cable pulley x5 each direction Wall squats 2x10 Standing forward T 2x10 bilat (UE support of counter/barre)  Standing rocker board DF/PF x2 min   DATE: 08/23/2023 Nustep level 6 x5 min with PT present to discuss status Standing hamstring stretch 2x20 sec bilat FWD step ups with 8" step 2x10 bilat Standing quad stretch with UE support 2x20 sec bilat Leg Press (seat at 8) 120# 2x10 Single Leg Press (seat at 8) 60# 2x10 bilat Soft Tissue Mobilization to left quad with manual trigger point release to promote tissue elongation and decreased pain   DATE: 08/17/2023 Nustep level 7 x6 min with PT present to discuss status Standing hamstring stretch at stairs 2x20 sec bilat Standing quad stretch with UE support 2x20 sec bilat Lateral lunges/curtsey 2x10 FWD lunge walking down hallway 2x10 Single leg stance with foot on flat side of half foam roll 2x20 sec bilat Leg Press (seat at 8) 120# 2x10 Single Leg Press (seat at 8) 60# 2x10 bilat 4 way resisted gait with 15# cable pulley x3 each direction Manual:  Addaday to left quad and lateral thigh    PATIENT EDUCATION:  Education details: Access Code: [email or web address] Person educated: Patient Education method: Explanation, Demonstration, and Handouts Education comprehension: verbalized understanding and returned demonstration  HOME EXERCISE PROGRAM: Access Code: [email or web address] URL: [email or web address] Date: 08/17/2023 Prepared by: Reather Laurence  Exercises - Supine Active Straight Leg Raise  - 2 x daily - 7 x weekly - 1-2 sets - 5-10 reps - Sidelying Hip Abduction  - 1 x daily - 7 x weekly - 2 sets - 10 reps - Seated Long Arc Quad  - 2 x daily - 7 x weekly - 1-2 sets - 5-10 reps - 5 hold - Standing Hamstring Stretch on Chair  - 1 x daily - 7 x weekly - 2 reps - 20 sec hold - Quadriceps Stretch with Chair  - 1 x daily - 7 x weekly - 2 reps - 20 sec hold - Walking Forward Lunge  - 1 x daily - 7 x weekly - 2 sets - 10  reps - Lateral Single Leg Lunge Jumps  - 1 x daily - 7 x weekly - 2 sets - 10 reps  ASSESSMENT:  CLINICAL IMPRESSION: [place] presents to skilled PT reporting that he has been able to resume going to the gym with his friend,  but did report some soreness after.  Pt educated on the importance of not overdoing it once he returns to the gym and to not push his body too far.  Patient able to progress with strengthening and single leg activities with session.  Pt is on track for discharge next visit to continue with HEP.  OBJECTIVE IMPAIRMENTS: Abnormal gait, decreased activity tolerance, difficulty walking, decreased strength, increased muscle spasms, impaired flexibility, and pain.   ACTIVITY LIMITATIONS: standing, squatting, stairs, transfers, and locomotion level  PARTICIPATION LIMITATIONS: driving, community activity, and occupation  PERSONAL FACTORS: Time since onset of injury/illness/exacerbation are also affecting patient's functional outcome.   REHAB POTENTIAL: Good  CLINICAL DECISION MAKING: Stable/uncomplicated  EVALUATION COMPLEXITY: Low   GOALS: Goals reviewed with patient? Yes  SHORT TERM GOALS: Target date: 08/22/2023   Be independent in initial HEP Baseline: Goal status: MET on 08/16/2023  2.  Report < or = to 4/10 Lt knee pain with flexion or after playing basketball  Baseline: 5-6/10 Goal status: MET on 08/02/2023  3.  Improve Lt quad strength to ascending steps with step-over-step gait with use of 1 rail Baseline: step to and instability of Lt LE Goal status: MET on 08/23/23  4.  Demonstrate symmetry with ambulation on level surfaces  Baseline: antalgia with reduce time spent in stance  Goal status: MET on 08/16/2023    LONG TERM GOALS: Target date: 09/19/2023    Be independent in advanced HEP Baseline:  Goal status: Ongoing  2.  Improve LEFS to > or 75/80 Baseline: 59/80 Goal status: INITIAL  3.  Improve LE strength to ascend and descend steps with  step-over-step gait and use of 1 rail Baseline: step-to Goal status: MET on 08/30/2023  4.  Demonstrate symmetry with gait on all surfaces  Baseline:  Goal status: MET on 08/30/2023  5.  Report < or = to 2-3/10 max Lt knee pain with activity  Baseline:  Goal status: MET on 08/30/2023     PLAN:  PT FREQUENCY: 2x/week  PT DURATION: 8 weeks  PLANNED INTERVENTIONS: Therapeutic exercises, Therapeutic activity, Neuromuscular re-education, Balance training, Gait training, Patient/Family education, Self Care, Joint mobilization, Stair training, Aquatic Therapy, Dry Needling, Electrical stimulation, Cryotherapy, Moist heat, Taping, Vasopneumatic device, Ionotophoresis 4mg /ml Dexamethasone, and Manual therapy  PLAN FOR NEXT SESSION: Discharge   [place], PT, DPT 08/30/23, 11:00 AM   [facility] [address], [address] [place], [place] [address] Phone # [phone] Fax [phone]

## 2023-09-01 ENCOUNTER — Ambulatory Visit: Payer: Medicaid Other | Admitting: Rehabilitative and Restorative Service Providers"

## 2023-09-01 ENCOUNTER — Encounter: Payer: Self-pay | Admitting: Rehabilitative and Restorative Service Providers"

## 2023-09-01 DIAGNOSIS — R2689 Other abnormalities of gait and mobility: Secondary | ICD-10-CM

## 2023-09-01 DIAGNOSIS — G8929 Other chronic pain: Secondary | ICD-10-CM

## 2023-09-01 DIAGNOSIS — M6281 Muscle weakness (generalized): Secondary | ICD-10-CM

## 2023-09-01 NOTE — Therapy (Signed)
OUTPATIENT PHYSICAL THERAPY TREATMENT AND DISCHARGE SUMMARY   Patient Name: Aaron Cross MRN: [id number] DOB:[birth year], 22 y.o., male Today's Date: 09/01/2023  END OF SESSION:  PT End of Session - 09/01/23 1019     Visit Number 8    Date for PT Re-Evaluation 09/19/23    Authorization Type Medicaid[employer] 27 visits    Authorization - Visit Number 8    Authorization - Number of Visits 27    PT Start Time 1017    PT Stop Time 1055    PT Time Calculation (min) 38 min    Activity Tolerance Patient tolerated treatment well    Behavior During Therapy WFL for tasks assessed/performed             History reviewed. No pertinent past medical history. History reviewed. No pertinent surgical history. There are no problems to display for this patient.   PCP: Aaron Gainer, MD   REFERRING PROVIDER: Dannielle Huh, MD  REFERRING DIAG: [phone] (ICD-10-CM) - Unspecified injury of unspecified quadriceps muscle, fascia and tendon, initial encounter   THERAPY DIAG:  Muscle weakness (generalized)  Other abnormalities of gait and mobility  Chronic pain of left knee  Rationale for Evaluation and Treatment: Rehabilitation  ONSET DATE: 6 months ago  SUBJECTIVE:   SUBJECTIVE STATEMENT: Pt reports that he had some pain after staining a deck yesterday, but overall, he is feeling better.  PERTINENT HISTORY: None   PAIN:  Are you having pain? Yes: NPRS scale: 3/10 Pain location: Lt lateral quad/knee Pain description: sore Aggravating factors: bending Lt knee, after playing basketball, installing flooring at work Relieving factors: unknown  PRECAUTIONS: None  RED FLAGS: None   WEIGHT BEARING RESTRICTIONS: No  FALLS:  Has patient fallen in last 6 months? No  LIVING ENVIRONMENT: Lives with: lives alone Lives in: House/apartment Stairs: No Has following equipment at home: None  OCCUPATION: Holiday representative work   PLOF: Independent, Vocation/Vocational requirements:  Holiday representative, and Leisure: basketball  PATIENT GOALS: reduce Lt leg pain and bend knee without pain  NEXT MD VISIT: as needed   OBJECTIVE:   DIAGNOSTIC FINDINGS: x-ray was negative   PATIENT SURVEYS:  Eval:  LEFS 59/80=73.73% of maximum function 09/01/2023:  Lower Extremity Functional Score: 73 / 80 = 91.3 %  COGNITION: Overall cognitive status: Within functional limits for tasks assessed     SENSATION: WFL  MUSCLE LENGTH: Lt hamstring limited by 25%  POSTURE: rounded shoulders and forward head  PALPATION: Palpable tension in medial and lateral distal quads.  Mild tension over distal medial quad  LOWER EXTREMITY ROM: Lt hamstring limited by 25%.  Full Lt knee A/ROM with pain at end range with overpressure  LOWER EXTREMITY MMT:  MMT Right eval Left eval  Hip flexion 5 4 (SLR)  Hip extension 5 5  Hip abduction 5 4+  Hip adduction    Hip internal rotation    Hip external rotation    Knee flexion 5 4-  Knee extension 5 4-  Ankle dorsiflexion 5 4+  Ankle plantarflexion    Ankle inversion    Ankle eversion     (Blank rows = not tested)  GAIT: Distance walked: 100 Assistive device utilized: None Level of assistance: Complete Independence Comments: antalgia with reduced time on Lt in stance. Steps: step to leading up with right and down with Lt.  Use of 1 rail.  Instability with ascending and descending on Lt   TODAY'S TREATMENT:  DATE: 09/01/2023 Nustep level 8 with LE only x6 min with PT present to discuss status LEFS Standing hamstring stretch 2x20 sec bilat Standing quad stretch with UE support 2x20 sec bilat FWD step ups onto PT mat x10 bilat Single leg sit to stand 2x10 bilat FWD lunge walking down hallway 2x10 FWD jumping with wide base of support 2x5 Backwards jumping with wide base of support 2x5 Seated butterfly stretch 2x20  sec  Quadruped alt UE/LE extension 2x10 Lateral lunges/curtsey 2x10 Running outside on sidewalk x300 ft without any reports of increased knee pain Standing rocker board DF/PF x2 min   DATE: 08/30/2023 Nustep level 6 x5 min with PT present to discuss status Standing hamstring stretch 2x20 sec bilat FWD step ups onto PT mat x10 bilat Standing quad stretch with UE support 2x20 sec bilat FWD lunge walking down hallway 2x10 Lateral lunges/curtsey 2x10 Single leg sit to stand 2x10 bilat Seated butterfly stretch 2x20 sec  4 way resisted gait with 15# cable pulley x5 each direction Wall squats 2x10 Standing forward T 2x10 bilat (UE support of counter/barre)  Standing rocker board DF/PF x2 min   DATE: 08/23/2023 Nustep level 6 x5 min with PT present to discuss status Standing hamstring stretch 2x20 sec bilat FWD step ups with 8" step 2x10 bilat Standing quad stretch with UE support 2x20 sec bilat Leg Press (seat at 8) 120# 2x10 Single Leg Press (seat at 8) 60# 2x10 bilat Soft Tissue Mobilization to left quad with manual trigger point release to promote tissue elongation and decreased pain    PATIENT EDUCATION:  Education details: Access Code: [email or web address] Person educated: Patient Education method: Explanation, Demonstration, and Handouts Education comprehension: verbalized understanding and returned demonstration  HOME EXERCISE PROGRAM: Access Code: [email or web address] URL: [email or web address] Date: 08/17/2023 Prepared by: Aaron Cross  Exercises - Supine Active Straight Leg Raise  - 2 x daily - 7 x weekly - 1-2 sets - 5-10 reps - Sidelying Hip Abduction  - 1 x daily - 7 x weekly - 2 sets - 10 reps - Seated Long Arc Quad  - 2 x daily - 7 x weekly - 1-2 sets - 5-10 reps - 5 hold - Standing Hamstring Stretch on Chair  - 1 x daily - 7 x weekly - 2 reps - 20 sec hold - Quadriceps Stretch with Chair  - 1 x daily - 7 x weekly - 2 reps - 20 sec hold - Walking Forward Lunge  - 1 x  daily - 7 x weekly - 2 sets - 10 reps - Lateral Single Leg Lunge Jumps  - 1 x daily - 7 x weekly - 2 sets - 10 reps  ASSESSMENT:  CLINICAL IMPRESSION: Aaron Cross has made excellent progress with skilled PT.  He is only reporting 3/10 pain after being on his knees at work yesterday staining a deck.  Patient reports that he used to be in near constant pain and he is now able to perform most desired tasks without increased pain.  Patient with great improvement with lower extremity functional scale.  Patient has met all goals at this time.  Patient discharged from skilled PT at this time to continue with HEP.  OBJECTIVE IMPAIRMENTS: Abnormal gait, decreased activity tolerance, difficulty walking, decreased strength, increased muscle spasms, impaired flexibility, and pain.   ACTIVITY LIMITATIONS: standing, squatting, stairs, transfers, and locomotion level  PARTICIPATION LIMITATIONS: driving, community activity, and occupation  PERSONAL FACTORS: Time since onset of injury/illness/exacerbation are also affecting patient's functional outcome.   REHAB POTENTIAL:  Good  CLINICAL DECISION MAKING: Stable/uncomplicated  EVALUATION COMPLEXITY: Low   GOALS: Goals reviewed with patient? Yes  SHORT TERM GOALS: Target date: 08/22/2023   Be independent in initial HEP Baseline: Goal status: MET on 08/16/2023  2.  Report < or = to 4/10 Lt knee pain with flexion or after playing basketball  Baseline: 5-6/10 Goal status: MET on 08/02/2023  3.  Improve Lt quad strength to ascending steps with step-over-step gait with use of 1 rail Baseline: step to and instability of Lt LE Goal status: MET on 08/23/23  4.  Demonstrate symmetry with ambulation on level surfaces  Baseline: antalgia with reduce time spent in stance  Goal status: MET on 08/16/2023    LONG TERM GOALS: Target date: 09/19/2023    Be independent in advanced HEP Baseline:  Goal status: MET  2.  Improve LEFS to > or 75/80 Baseline:  59/80 Goal status: MET on 09/01/2023  3.  Improve LE strength to ascend and descend steps with step-over-step gait and use of 1 rail Baseline: step-to Goal status: MET on 08/30/2023  4.  Demonstrate symmetry with gait on all surfaces  Baseline:  Goal status: MET on 08/30/2023  5.  Report < or = to 2-3/10 max Lt knee pain with activity  Baseline:  Goal status: MET on 08/30/2023     PLAN:  PT FREQUENCY: 2x/week  PT DURATION: 8 weeks  PLANNED INTERVENTIONS: Therapeutic exercises, Therapeutic activity, Neuromuscular re-education, Balance training, Gait training, Patient/Family education, Self Care, Joint mobilization, Stair training, Aquatic Therapy, Dry Needling, Electrical stimulation, Cryotherapy, Moist heat, Taping, Vasopneumatic device, Ionotophoresis 4mg /ml Dexamethasone, and Manual therapy   PHYSICAL THERAPY DISCHARGE SUMMARY   Patient agrees to discharge. Patient goals were met. Patient is being discharged due to meeting the stated rehab goals.     Aaron Cross, PT, DPT 09/01/23, 11:02 AM   [facility] [address], [address] [place], [place] [address] Phone # [phone] Fax [phone]

## 2024-05-27 ENCOUNTER — Emergency Department (HOSPITAL_BASED_OUTPATIENT_CLINIC_OR_DEPARTMENT_OTHER)
Admission: EM | Admit: 2024-05-27 | Discharge: 2024-05-27 | Disposition: A | Discharge status: Home / Self Care | Attending: Emergency Medicine | Admitting: Emergency Medicine

## 2024-05-27 ENCOUNTER — Other Ambulatory Visit: Payer: Self-pay

## 2024-05-27 ENCOUNTER — Encounter (HOSPITAL_BASED_OUTPATIENT_CLINIC_OR_DEPARTMENT_OTHER): Payer: Self-pay

## 2024-05-27 DIAGNOSIS — L918 Other hypertrophic disorders of the skin: Secondary | ICD-10-CM | POA: Insufficient documentation

## 2024-05-27 NOTE — Discharge Instructions (Signed)
 You were seen in the ER today for concerns of a skin tag. We unfortunately cannot remove these in the ER but your area does not appear to be infected. I would suggest keeping the area covered with gauze for padding to avoid irritation from your legs rubbing. Please follow up with your primary care provider or dermatologist for possible removal of this skin tag. If any signs of infection develop such as drainage, fever, severe redness, return to the ER.

## 2024-05-27 NOTE — ED Provider Notes (Signed)
 [place] EMERGENCY DEPARTMENT AT [facility] Provider Note   CSN: [id number] Arrival date & time: 05/27/24  1858     History Chief Complaint  Patient presents with   Skin Tag   Abscess    Aaron Cross is a 23 y.o. male.  Patient presents to the emergency department today with concerns of a skin tag.  Reports this been ongoing for the last several weeks is unsure exactly when it developed.  He states that he is concerned that there is becoming more painful.  He does report that he notices the pain typically worsens when he tries to walk and the area rubs together.  Denies recent fever, chills or bodyaches.   Abscess      Home Medications Prior to Admission medications   Medication Sig Start Date End Date Taking? Authorizing Provider  benzonatate  (TESSALON ) 100 MG capsule Take 1 capsule (100 mg total) by mouth every 8 (eight) hours. 12/02/22   Henderly, Britni A, PA-C  diphenhydrAMINE  (BENADRYL ) 25 MG tablet Take 1 tablet (25 mg total) by mouth every 6 (six) hours as needed. 02/10/20   Horton, Vonzella Guernsey, MD  famotidine  (PEPCID ) 20 MG tablet Take 1 tablet (20 mg total) by mouth daily. 02/10/20   Horton, Vonzella Guernsey, MD  fluticasone  (FLONASE ) 50 MCG/ACT nasal spray Place 2 sprays into both nostrils daily. 12/02/22   Henderly, Britni A, PA-C  montelukast (SINGULAIR) 10 MG tablet Take 10 mg by mouth at bedtime.    [provider]  predniSONE  (DELTASONE ) 20 MG tablet Take 2 tablets (40 mg total) by mouth daily. 02/10/20   Horton, Vonzella Guernsey, MD      Allergies    Patient has no known allergies.    Review of Systems   Review of Systems  Skin:  Positive for wound.  All other systems reviewed and are negative.   Physical Exam Updated Vital Signs BP 120/69   Pulse 94   Temp 97.9 F (36.6 C)   Resp 15   Ht 6' (1.829 m)   Wt 86.2 kg   SpO2 96%   BMI 25.77 kg/m  Physical Exam Vitals and nursing note reviewed.  Constitutional:      General: He is not in acute  distress.    Appearance: He is well-developed.  HENT:     Head: Normocephalic and atraumatic.  Eyes:     Conjunctiva/sclera: Conjunctivae normal.  Cardiovascular:     Rate and Rhythm: Normal rate and regular rhythm.     Heart sounds: No murmur heard. Pulmonary:     Effort: Pulmonary effort is normal. No respiratory distress.     Breath sounds: Normal breath sounds.  Abdominal:     Palpations: Abdomen is soft.     Tenderness: There is no abdominal tenderness.  Musculoskeletal:        General: No swelling.     Cervical back: Neck supple.  Skin:    General: Skin is warm and dry.     Capillary Refill: Capillary refill takes less than 2 seconds.     Findings: Lesion present.     Comments: Skin tag present to the inner right thigh. No obvious signs of cellulitis or abscess at this time.  Neurological:     Mental Status: He is alert.  Psychiatric:        Mood and Affect: Mood normal.     ED Results / Procedures / Treatments   Labs (all labs ordered are listed, but only abnormal results are displayed)  Labs Reviewed - No data to display  EKG None  Radiology No results found.  Procedures Procedures    Medications Ordered in ED Medications - No data to display  ED Course/ Medical Decision Making/ A&P                                 Medical Decision Making  This patient presents to the ED for concern of skin tag.  Differential diagnosis includes cellulitis, abscess, condyloma,    Problem List / ED Course:  Patient presents to the emergency department today with concerns of a skin tag or possible infection.  Reports its to the right inner thigh/groin.  Reports has been ongoing for the last several days and has grown in the last week due to irritation.  He states that he typically notices worsening pain when he tries to walk.  Denies any recent drainage or discharge coming from the area. On exam, patient has an erythematous skin tag but no obvious skin induration present  to the right upper thigh.  No obvious signs of cellulitis or abscess present.  I suspect patient's erythematous skin tag is likely due to friction.  No obvious indication for emergent removal here in the emergency department.  Advised patient to follow-up with primary care provider for further evaluation and possible removal or dermatology for removal of the skin tag.  Encourage continued covering of the skin tag to reduce friction and irritation to the area.  Otherwise stable at this time for outpatient follow-up and discharged home with plans for evaluation by primary care provider or dermatology.  Patient verbalized understanding all return precautions.   Final Clinical Impression(s) / ED Diagnoses Final diagnoses:  Skin tag    Rx / DC Orders ED Discharge Orders     None         Concetta Dee, PA-C 05/27/24 2301    Rosealee Concha, MD 05/28/24 [phone]

## 2024-05-27 NOTE — ED Triage Notes (Signed)
 Skin tag to right inner thigh/ groin.  Appears as an abscess on end of skin tag.  Growing in size over the last week.  Had tag for about a year.  States painful to walk
